# Patient Record
Sex: Female | Born: 1935 | Race: White | Hispanic: No
Health system: Midwestern US, Community
[De-identification: ages and names within clinical notes are randomized; demographics above are authoritative.]

---

## 2021-01-01 IMAGING — MR MRCP (MR Cholangiogram w/Reconstructions)
9 of 11 series · 35 of 48 positions shown · non-contrast
Comparison: none

[Series 2: t2_cor · coronal · 6.0mm · 1.25mm/px · 2 of 30 slices shown]
[im 1/30]
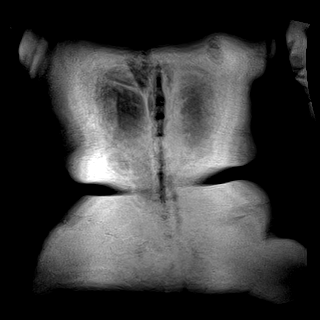
[im 30/30]
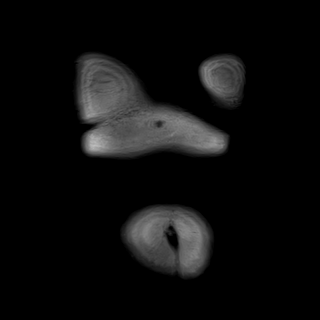

[Series 3: t2_axial_fs_bh · axial · 6.0mm · 1.19mm/px · z∈[-80,+129]mm · 2 of 30 slices shown]
[im 1/30]
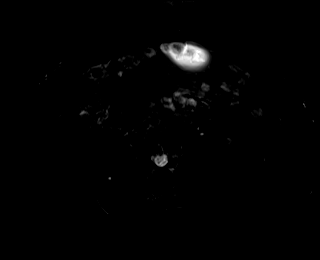
[im 30/30]
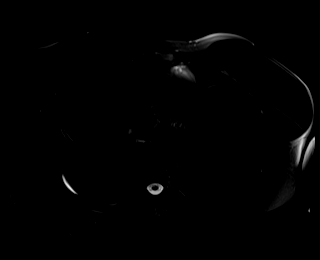

[Series 5: t2_cor r · coronal · 6.0mm · 1.25mm/px · 2 of 30 slices shown]
[im 1/30]
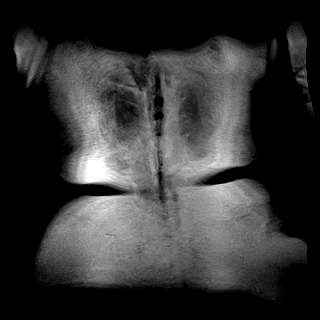
[im 30/30]
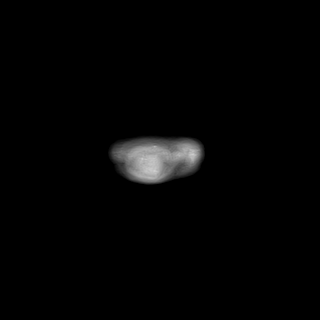

[Series 12: t2_space_cor_cs20_trig_384_iso · coronal · 1.0mm · 0.49mm/px · 5 of 66 slices shown]
[im 1/66]
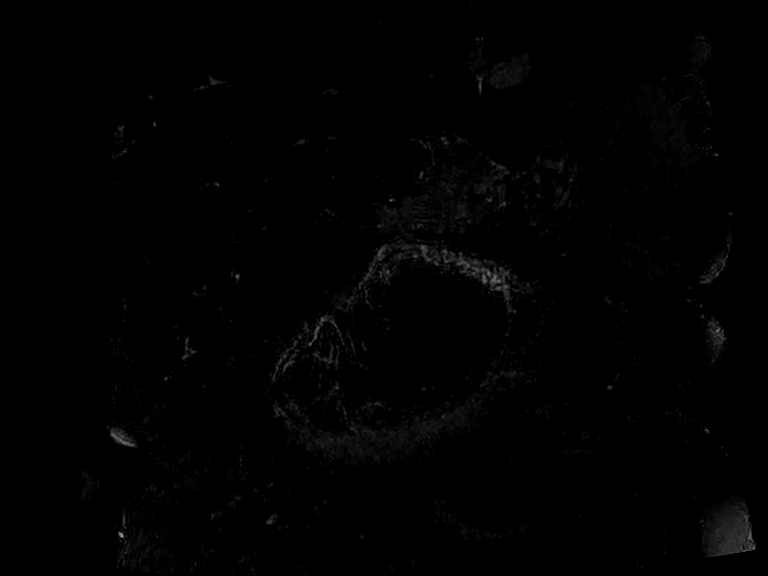
[im 17/66]
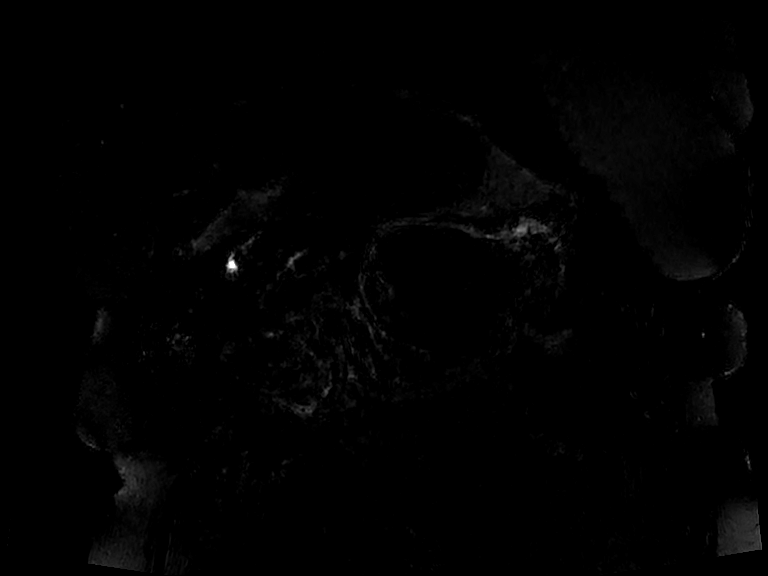
[im 33/66]
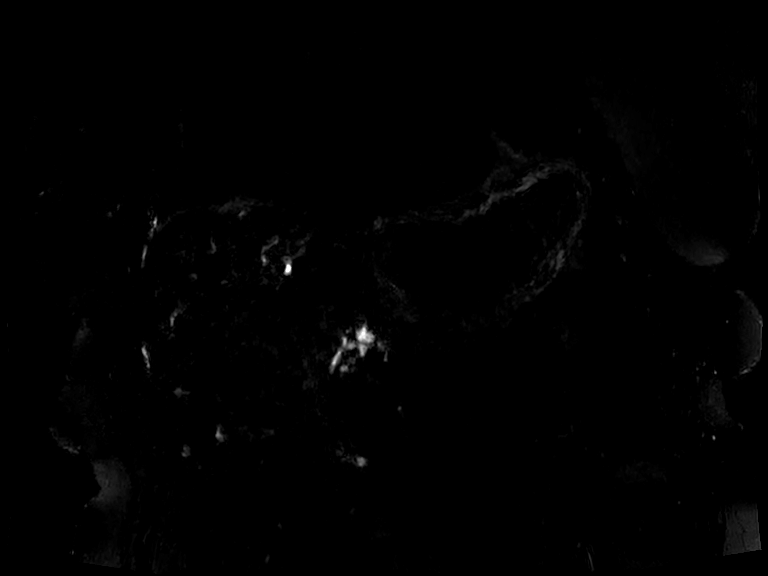
[im 49/66]
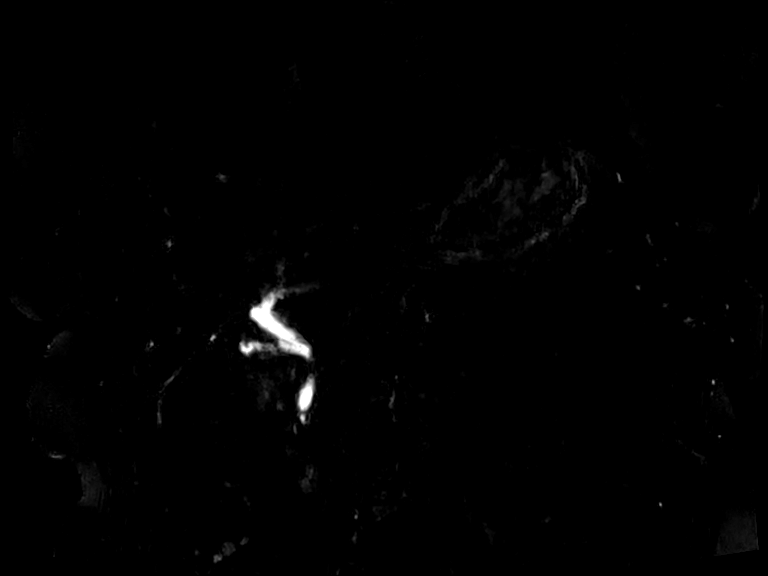
[im 66/66]
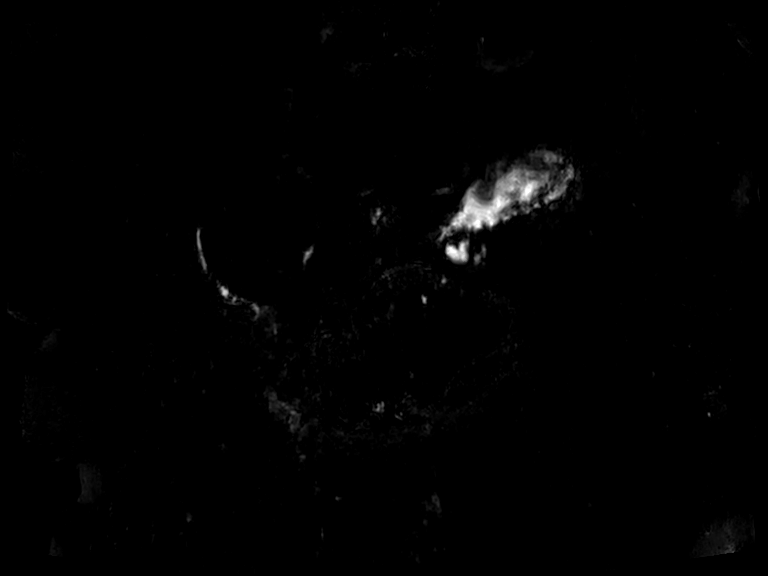

[Series 13: t2_space_cor_cs20_trig_384_iso_mip_radials · sagittal · 0.49mm/px · 1 of 4 slices shown]
[im 1/4]
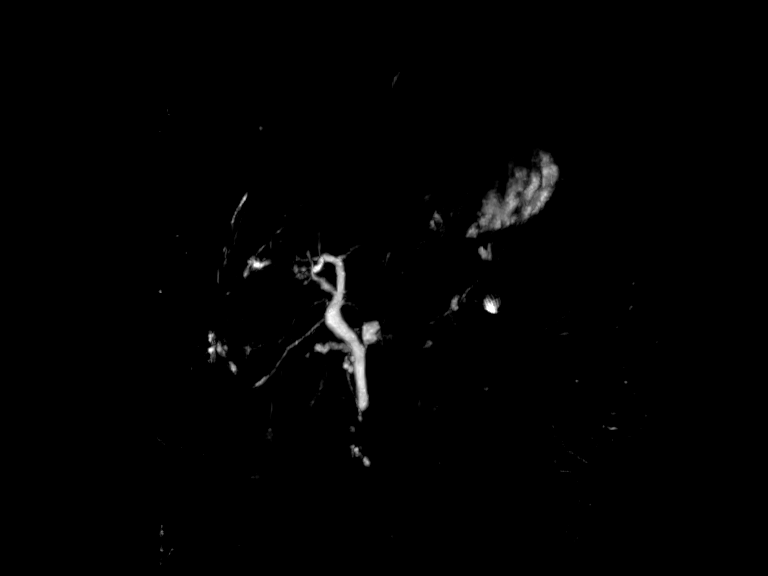

[Series 14: t1_vibe_(person_name)_pre_opp · axial · 3.0mm · 1.56mm/px · z∈[-35,+174]mm · 6 of 72 slices shown]
[im 1/72]
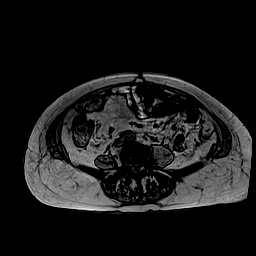
[im 15/72]
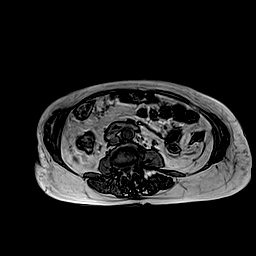
[im 29/72]
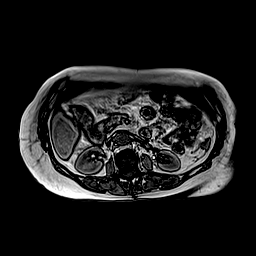
[im 43/72]
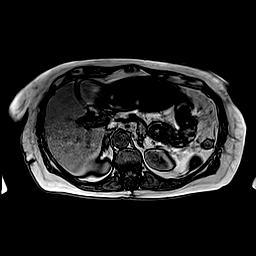
[im 57/72]
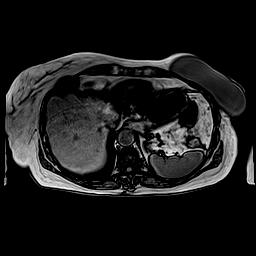
[im 72/72]
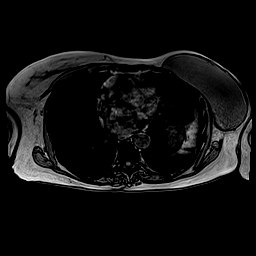

[Series 15: t1_vibe_(person_name)_pre_in · axial · 3.0mm · 1.56mm/px · z∈[-35,+174]mm · 6 of 72 slices shown]
[im 1/72]
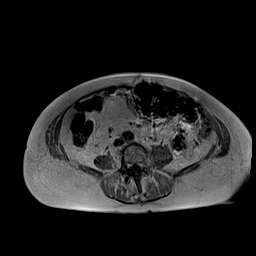
[im 15/72]
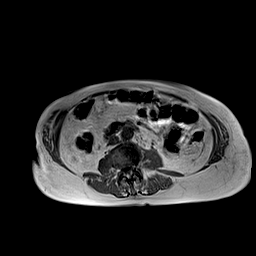
[im 29/72]
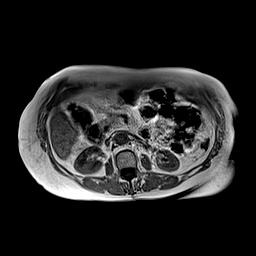
[im 43/72]
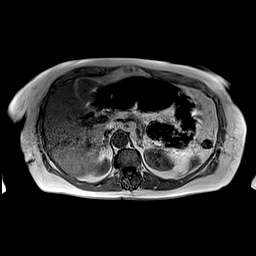
[im 57/72]
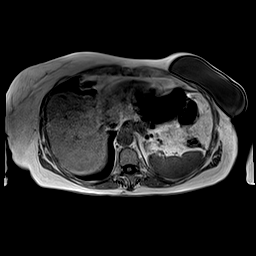
[im 72/72]
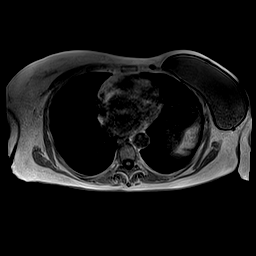

[Series 17: t1_vibe_(person_name)_pre_w · axial · 3.0mm · 1.56mm/px · z∈[-35,+174]mm · 6 of 72 slices shown]
[im 1/72]
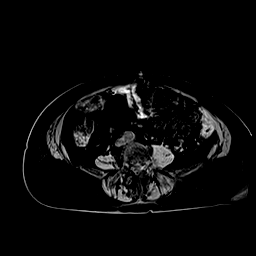
[im 15/72]
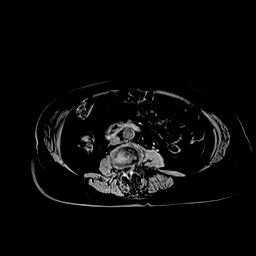
[im 29/72]
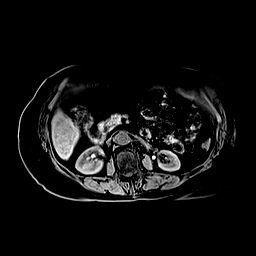
[im 43/72]
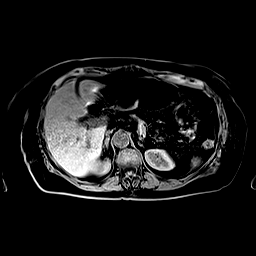
[im 57/72]
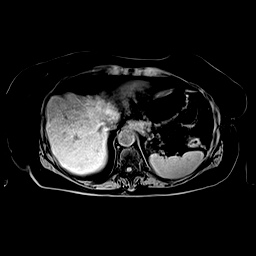
[im 72/72]
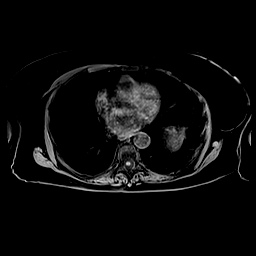

[Series 21: t1_vibe_axial_(person_name)_+c1_w · axial · 3.0mm · 1.56mm/px · z∈[-35,+130]mm · 5 of 72 slices shown]
[im 1/72]
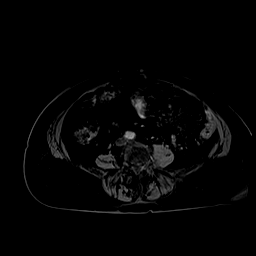
[im 15/72]
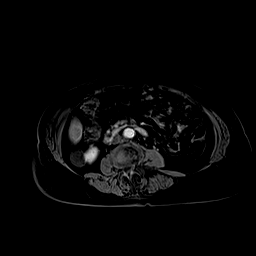
[im 29/72]
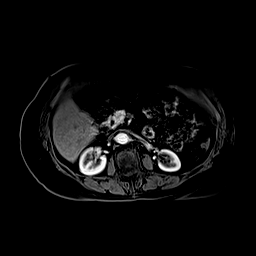
[im 43/72]
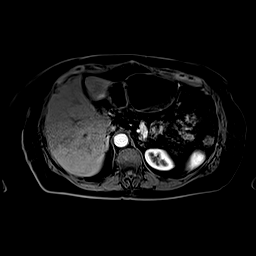
[im 57/72]
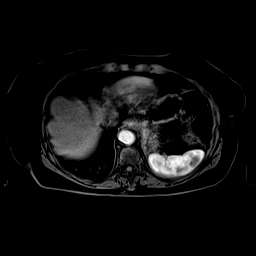

[35 of 48 positions shown; findings below may reference images not displayed]

REASON FOR EXAM

*
Epigastric pain

COMPARISON

*
None

TECHNIQUE

*
MR images of the abdomen were acquired before and after the administration of 15 mL ProHance IV contrast

*
Heavily T2 weighted MRCP images were acquired

*
3-D rotating MIP images were generated with post-processing software under concurrent physician supervision 

FINDINGS

Biliary Ducts

*
Maximum diameter of the common bile/common hepatic ducts is 9 mm

*
No mural irregularity

*
No rounded filling defects in the extra-hepatic bile ducts

*
Mild intrahepatic biliary dilatation

*
Gallbladder not visualized

Pancreatic Ducts

*
Normal ductal anatomy

*
Maximum diameter of the main pancreatic duct is 1 mm

*
No ductal stricture

Pancreatic Cystic Lesions

*
T2 very hyperintense

*
Nonenhancing

*
Multiple thin septa

*
No solid component

*
Two largest lesions described below

*
Pancreatic tail lesion ([DATE]) 2.1 x 1.3 cm

*
Pancreatic body lesion ([DATE]) 1.0 x 1.1 cm

*
Difficult to comment on potential communication with the main pancreatic duct

Liver

*
Smooth margins

*
Normal size

*
No steatosis

*
No lesion

Portal

*
Patent portal vein, splenic vein, and SMV

*
Patent hepatic veins

*
No ascites

*
No significant collateral veins

Additional

*
No solid pancreatic lesion

*
Renal cysts

*
No lymphadenopathy

*
Scoliosis with DISH

*
No malignant osseous enhancement

*
Left breast implant

IMPRESSION

*
Pancreatic cystic lesions. No worrisome features or high-risk stigmata to suggest an aggressive mucinous neoplasm. Consider follow-up MRCP in 6 months

## 2021-07-30 IMAGING — MR MRCP (MR Cholangiogram w/Reconstructions)
9 of 12 series · 35 of 48 positions shown · IV contrast (Prohance)
Comparison: none

[Series 2: t2_cor · coronal · 6.0mm · 1.09mm/px · 3 of 32 slices shown]
[im 1/32]
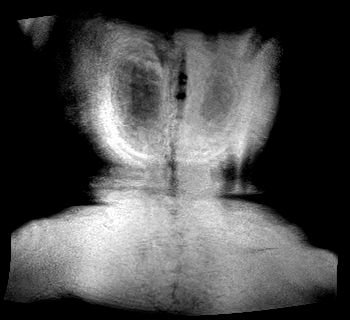
[im 16/32]
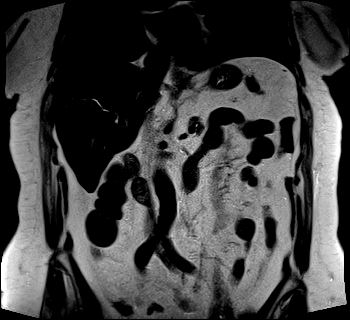
[im 32/32]
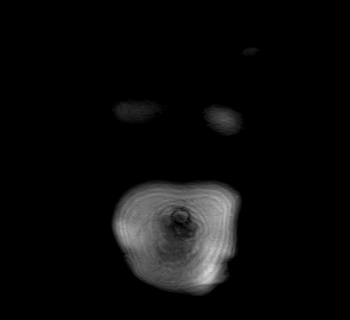

[Series 7: t2_space_cor_cs20_trig_384_iso · coronal · 1.0mm · 0.49mm/px · 6 of 87 slices shown]
[im 1/87]
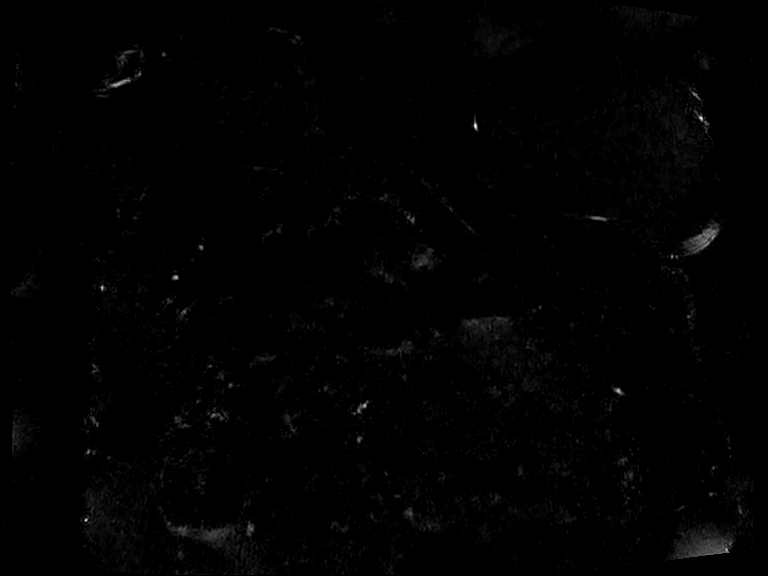
[im 18/87]
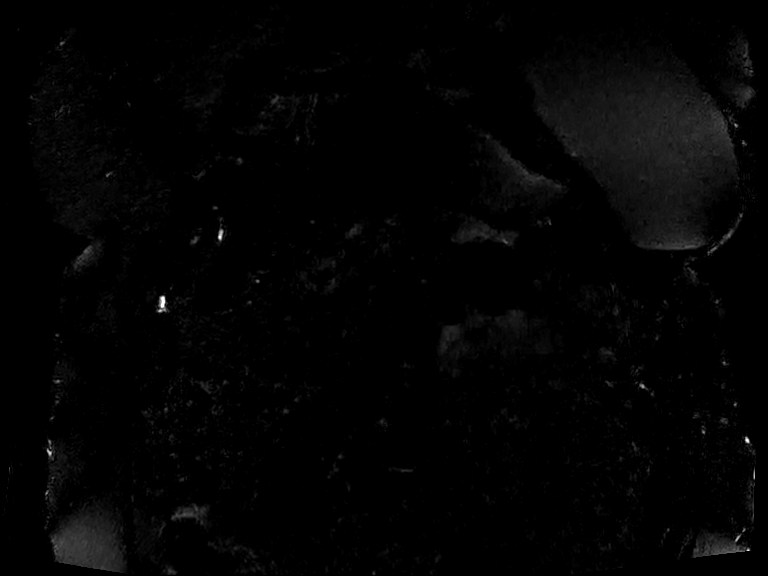
[im 35/87]
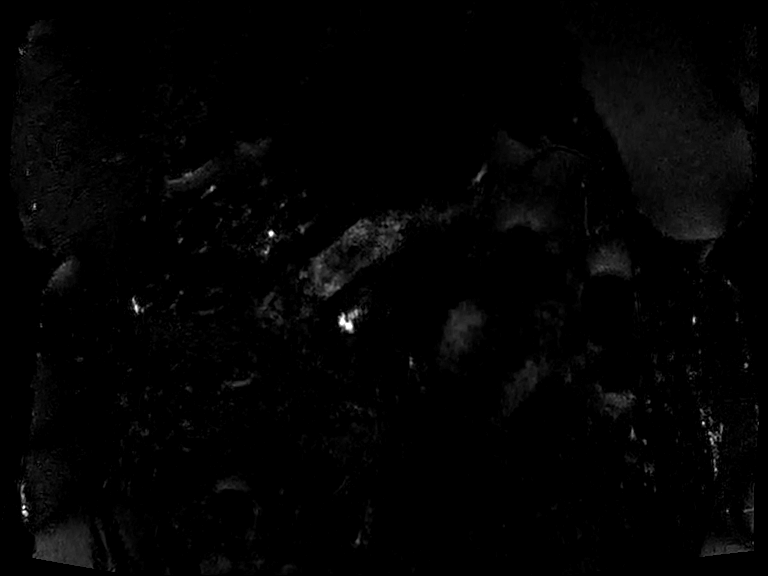
[im 52/87]
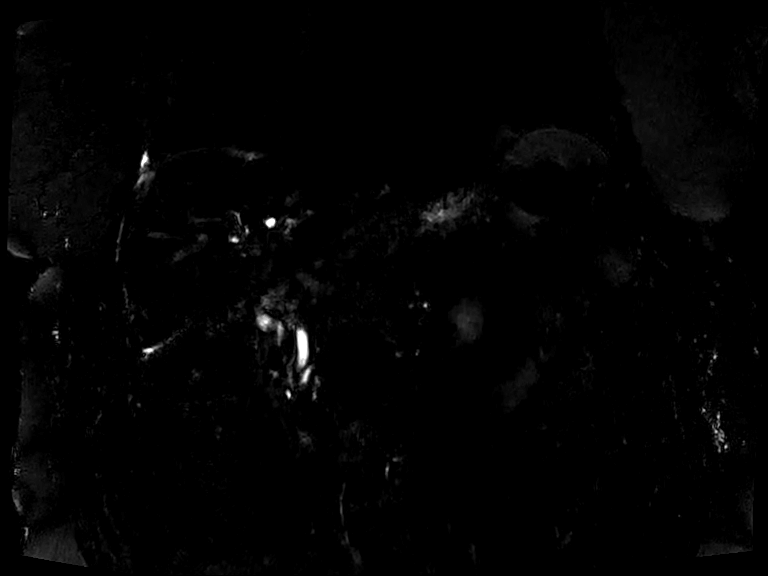
[im 69/87]
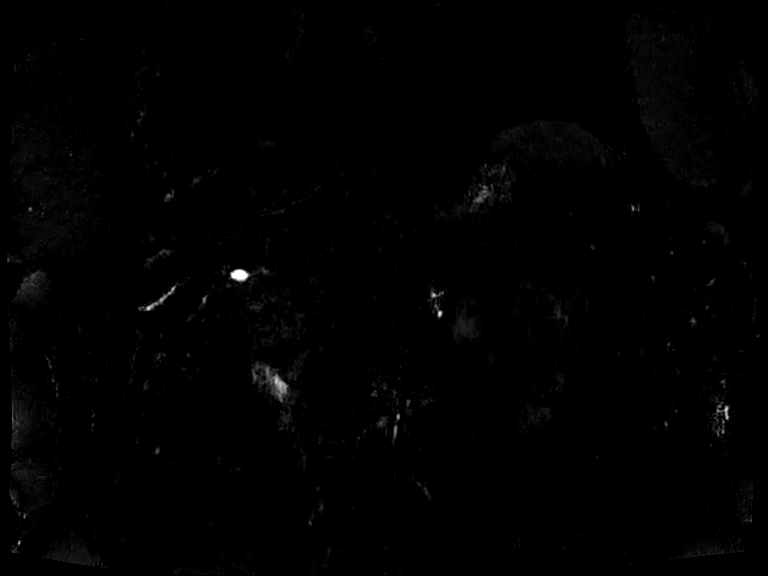
[im 87/87]
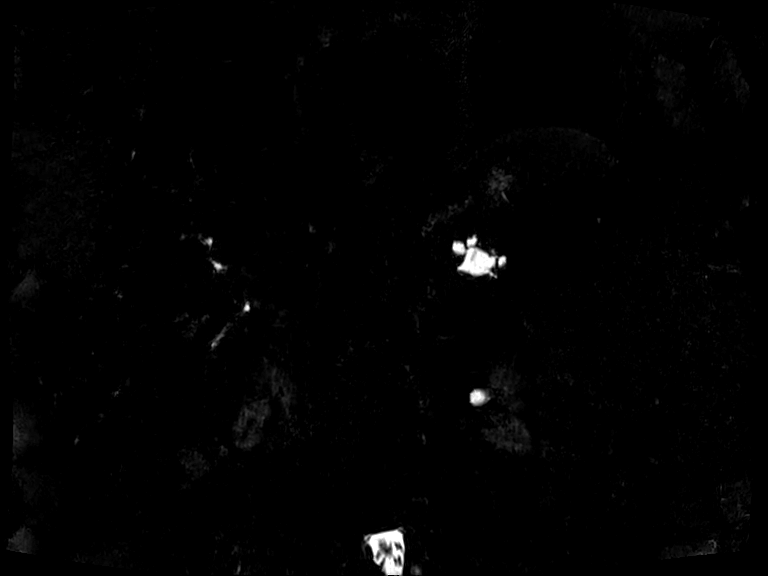

[Series 9: t2_axial_fs_bh · axial · 6.0mm · 1.19mm/px · z∈[-99,+137]mm · 3 of 34 slices shown]
[im 1/34]
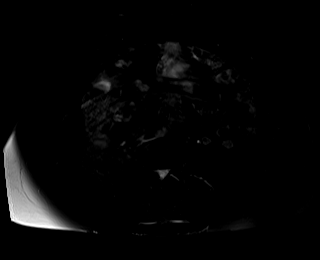
[im 17/34]
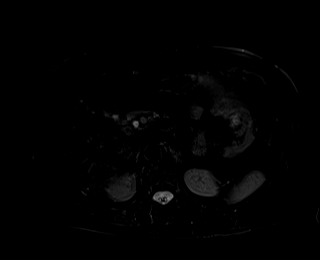
[im 34/34]
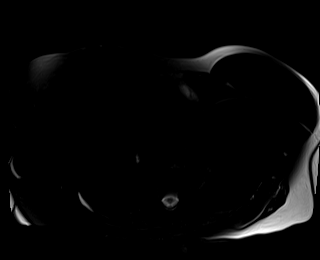

[Series 10: t1_vibe_(person_name)_pre_opp · axial · 3.0mm · 1.56mm/px · z∈[-77,+134]mm · 4 of 72 slices shown]
[im 1/72]
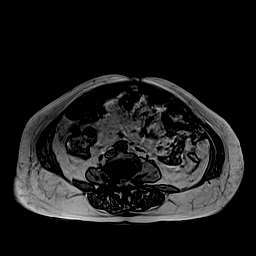
[im 24/72]
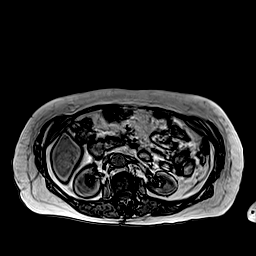
[im 48/72]
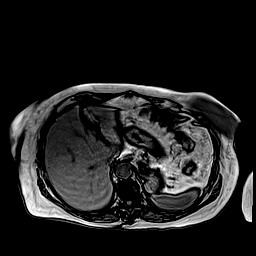
[im 72/72]
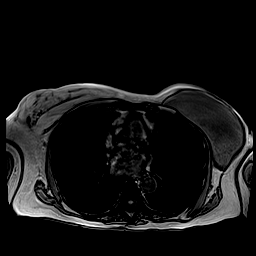

[Series 11: t1_vibe_(person_name)_pre_in · axial · 3.0mm · 1.56mm/px · z∈[-77,+134]mm · 4 of 72 slices shown]
[im 1/72]
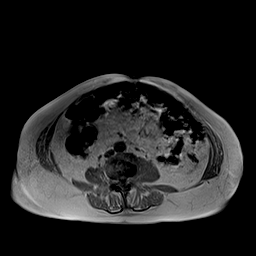
[im 24/72]
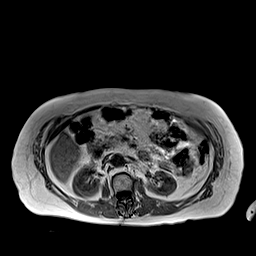
[im 48/72]
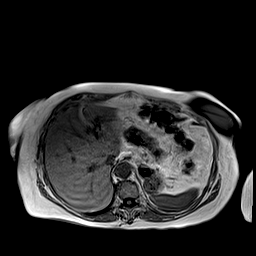
[im 72/72]
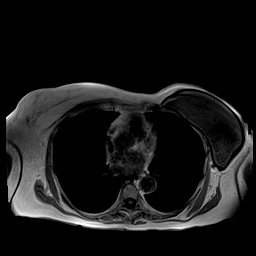

[Series 13: t1_vibe_(person_name)_pre_w · axial · 3.0mm · 1.56mm/px · z∈[-77,+134]mm · 4 of 72 slices shown]
[im 1/72]
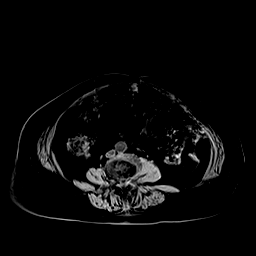
[im 24/72]
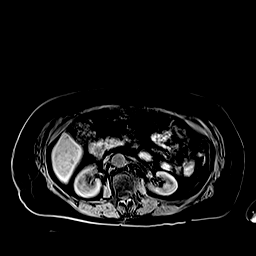
[im 48/72]
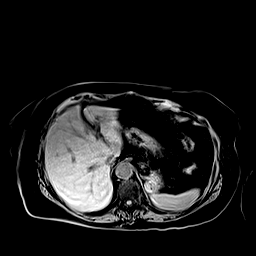
[im 72/72]
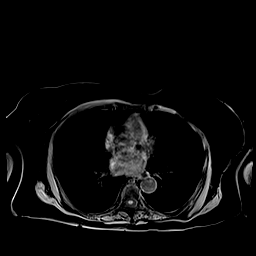

[Series 17: t1_vibe_axial_(person_name)_+c1_w · axial · 3.0mm · 1.56mm/px · z∈[-77,+134]mm · 4 of 72 slices shown]
[im 1/72]
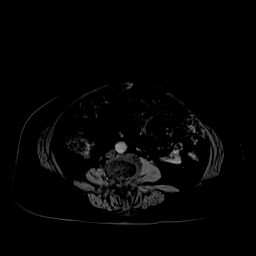
[im 24/72]
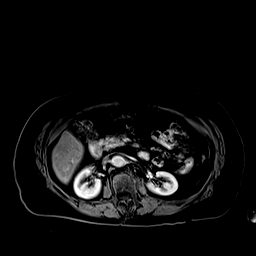
[im 48/72]
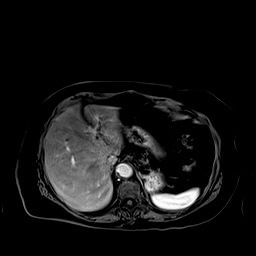
[im 72/72]
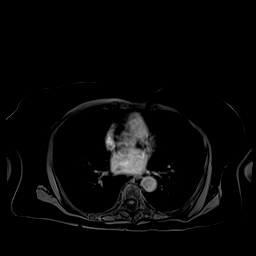

[Series 21: t1_vibe_axial_(person_name)_+c2_w · axial · 3.0mm · 1.56mm/px · z∈[-77,+134]mm · 4 of 72 slices shown]
[im 1/72]
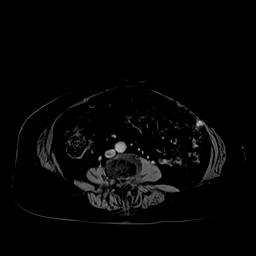
[im 24/72]
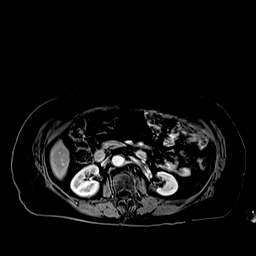
[im 48/72]
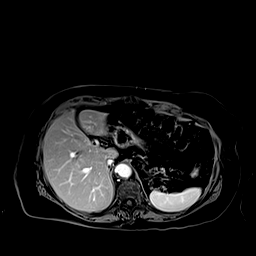
[im 72/72]
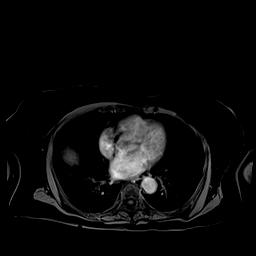

[Series 25: t1_vibe_axial_(person_name)_+c3_w · axial · 3.0mm · 1.56mm/px · z∈[-77,+63]mm · 3 of 72 slices shown]
[im 1/72]
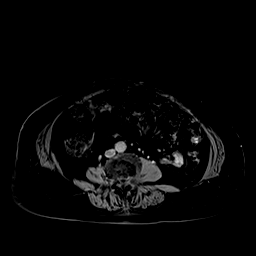
[im 24/72]
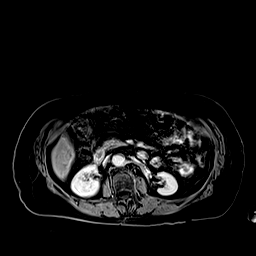
[im 48/72]
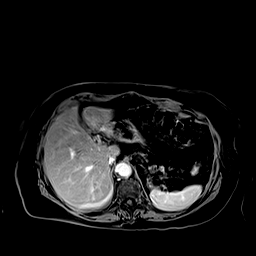

[35 of 48 positions shown; findings below may reference images not displayed]

REASON FOR EXAM

*
Pancreatic cystic lesion

COMPARISON

*
MRCP 01/01/2021

TECHNIQUE

*
MR images of the abdomen were acquired before and after the administration of 15 mL ProHance IV contrast

*
Heavily T2 weighted MRCP images were acquired

*
3-D rotating MIP images were generated with post-processing software under concurrent physician supervision

FINDINGS

Biliary Ducts

*
Maximum diameter of the common bile/common hepatic ducts is 9 mm

*
No mural irregularity

*
No rounded filling defects in the extra-hepatic bile ducts

*
Mild intrahepatic biliary dilatation

*
Cholecystectomy

Pancreatic Ducts

*
Normal ductal anatomy

*
Maximum diameter of the main pancreatic duct is 1.5 mm

*
No ductal stricture

Pancreatic Parenchyma

*
Cystic lesions are unchanged, largest 2 described below

*
Pancreatic tail cystic lesion ([DATE])   2.2 x 1.3 cm   --->   2.1 x 1.3 cm

*
Pancreatic head cystic lesion ([DATE])   1.0 x 1.1 cm   --->   0.9 x 1.0 cm

*
No significant solid lesion

Liver

*
Smooth margins

*
Normal size

*
No steatosis

*
No lesion

Portal

*
Patent portal vein, splenic vein, and SMV

*
Patent hepatic veins

*
No ascites

*
No significant collateral veins

Kidneys and Adrenals

*
Multiple renal cysts

Lymph Nodes

*
No suspicious lymph nodes

Additional

*
No malignant osseous enhancement

*
Left breast implant

IMPRESSION

*
Pancreatic cystic lesions are stable since 01/01/2021. Consider follow-up MRCP in one year

## 2022-11-04 ENCOUNTER — Emergency Department: Admit: 2022-11-05 | Payer: MEDICARE

## 2022-11-04 ENCOUNTER — Inpatient Hospital Stay: Admit: 2022-11-04 | Discharge: 2022-11-05 | Disposition: A | Discharge status: Home / Self Care | Payer: MEDICARE

## 2022-11-04 DIAGNOSIS — S8391XA Sprain of unspecified site of right knee, initial encounter: Secondary | ICD-10-CM

## 2022-11-04 NOTE — ED Provider Notes (Signed)
Independent APP Visit.       [facility] EMERGENCY DEPARTMENT  ED  Encounter Note  Admit Date/RoomTime: 11/04/2022 11:17 PM  ED Room: 31/31  NAME: Paula Cooper  DOB: 03/13/1934  MRN: [id number]  PCP: No primary care provider on file.    CHIEF COMPLAINT     Knee Injury (Pt was getting off an airplane at 1800 today and heard + felt her right knee pop when she was walking. Pt complaint of pain from her right ankle to her knee. Pt also has swelling of her knee. )    HISTORY OF PRESENT ILLNESS        Paula Cooper is a 88 y.o. female who presents to the ED via private vehicle with complaints of a popping sensation in her right knee while walking and now has pain primarily to the lateral aspect of the right knee that radiates into her leg.  Mild amount of swelling surrounding the knee.  Painful ambulation  REVIEW OF SYSTEMS     Pertinent positives and negatives are stated within HPI, all other systems reviewed and are negative.    Past Medical History:  has no past medical history on file.  Surgical History:  has no past surgical history on file.  Social History:  reports that she has never smoked. She has quit using smokeless tobacco. She reports that she does not currently use alcohol. She reports that she does not use drugs.  Family History: family history is not on file.   Allergies: Patient has no known allergies.  CURRENT MEDICATIONS       Previous Medications    No medications on file       SCREENINGS     Glasgow Coma Scale  Eye Opening: Spontaneous  Best Verbal Response: Oriented  Best Motor Response: Obeys commands  Glasgow Coma Scale Score: 15         CIWA Assessment  BP: 131/79  Pulse: 91       PHYSICAL EXAM   Oxygen Saturation Interpretation: Normal on room air analysis.        ED Triage Vitals   BP Temp Temp Source Pulse Respirations SpO2 Height Weight   11/04/22 1959 11/04/22 1850 11/04/22 1850 11/04/22 1850 11/04/22 1959 11/04/22 1850 -- --   131/79 97.9 F (36.6  C) Temporal 100 16 95 %         Physical Exam  General: Awake alert and oriented.  Well-appearing.  Nontoxic.  HEENT: Normocephalic, atraumatic.  Pupils equal  Neck: Normal range of motion  Cardiac: Regular rate  Respiratory: Respirations even, unlabored.  No respiratory distress  Abdomen: Nondistended  Musculoskelatal: Right knee with mild amount of swelling and rather diffuse tenderness.  Full range of motion with pain.  Easily palpated dorsalis pedis posterior tibial pulses neuro: Nonlateralizing  Skin: Flesh tone, warm, dry  Psych: Normal affect  DIAGNOSTIC RESULTS   (All laboratory and radiology results have been personally reviewed by myself)  Labs:  No results found for this visit on 11/04/22.  Imaging:  All Radiology results interpreted by Radiologist unless otherwise noted.  XR KNEE RIGHT (MIN 4 VIEWS)   Final Result   Normal right knee four views.             ED COURSE   Vitals:    Vitals:    11/04/22 1850 11/04/22 1959   BP:  131/79   Pulse: 100 91   Resp:  16   Temp:  97.9 F (36.6 C)    TempSrc: Temporal    SpO2: 95% 96%       Patient was given the following medications:  Medications   acetaminophen (TYLENOL) tablet 650 mg (650 mg Oral Given 11/04/22 2020)          PROCEDURES       REASSESSMENT   11/04/22       Time:   Patient's condition .    CONSULTS:  None  DIFFERENTIAL DX_MDM   MDM:   Social Determinants : None    Records Reviewed : None_ n/a per encounter visit    CC/HPI Summary, DDx, ED Course, and Reassessment: Patient presents with Knee Injury (Pt was getting off an airplane at 1800 today and heard + felt her right knee pop when she was walking. Pt complaint of pain from her right ankle to her knee. Pt also has swelling of her knee. )  Patient presenting with mechanical injury of the right knee.  X-rays not demonstrating any acute fracture.  She has full but painful range of motion.  Pain with weightbearing.  She is placed in a knee immobilizer.  Advised to follow-up with Ortho.  Educated on rice  therapy as well as over-the-counter analgesics.    Plan of Care/Counseling:  Pamella Pert Eman, APRN - CNP reviewed today's visit with the patient in addition to providing specific details for the plan of care and counseling regarding the diagnosis and prognosis.  Questions are answered at this time and are agreeable with the plan.    ASSESSMENT     1. Sprain of right knee, unspecified ligament, initial encounter    2. Acute pain of right knee        DISPOSITION   Discharged home.  Patient condition is good    Discharge Instructions:   Patient referred to  [facility], [address], [place] [address]  [phone]    Schedule an appointment as soon as possible for a visit       [facility] Emergency Department  [place] [address]  [phone]    If symptoms worsen    NEW MEDICATIONS     New Prescriptions    No medications on file     Electronically signed by Desiree Hane, APRN - CNP   DD: 11/04/22  **This report was transcribed using voice recognition software. Every effort was made to ensure accuracy; however, inadvertent computerized transcription errors may be present.  END OF ED PROVIDER NOTE      Desiree Hane, APRN - CNP  11/04/22 2348  ATTENDING PROVIDER ATTESTATION:     Supervising Physician, on-site, available for consultation, non-participatory in the evaluation or care of this patient       Mordecai Rasmussen, MD  11/05/22 0301

## 2022-11-04 NOTE — Discharge Instructions (Signed)
Wear knee immobilizer for comfort.  Consider purchasing a walker for ambulation assistance.  Over-the-counter Tylenol for pain.  Ice 20 minutes on 20 minutes off at least 4 times a day.  Elevate while at rest.

## 2022-11-05 MED ORDER — ACETAMINOPHEN 325 MG PO TABS
325 MG | Freq: Once | ORAL | Status: AC
Start: 2022-11-05 — End: 2022-11-04
  Administered 2022-11-05: 01:00:00 650 mg via ORAL

## 2022-11-05 MED FILL — PAIN & FEVER 325 MG PO TABS: 325 MG | ORAL | Qty: 2

## 2023-11-08 ENCOUNTER — Inpatient Hospital Stay
Admit: 2023-11-08 | Discharge: 2023-11-08 | Disposition: A | Discharge status: Home / Self Care | Payer: MEDICARE | Attending: Emergency Medicine

## 2023-11-08 ENCOUNTER — Emergency Department: Admit: 2023-11-08 | Payer: MEDICARE

## 2023-11-08 DIAGNOSIS — R52 Pain, unspecified: Secondary | ICD-10-CM

## 2023-11-08 DIAGNOSIS — J069 Acute upper respiratory infection, unspecified: Secondary | ICD-10-CM

## 2023-11-08 LAB — CBC WITH AUTO DIFFERENTIAL
Basophils %: 0 % (ref 0.0–2.0)
Basophils Absolute: 0.03 10*3/uL (ref 0.00–0.20)
Eosinophils %: 0 % (ref 0–6)
Eosinophils Absolute: 0.04 10*3/uL — ABNORMAL LOW (ref 0.05–0.50)
Hematocrit: 45.2 % (ref 34.0–48.0)
Hemoglobin: 15 g/dL (ref 11.5–15.5)
Immature Granulocytes %: 0 % (ref 0.0–5.0)
Immature Granulocytes Absolute: <0.03 10*3/uL (ref 0.00–0.58)
Lymphocytes %: 3 % — ABNORMAL LOW (ref 20.0–42.0)
Lymphocytes Absolute: 0.38 10*3/uL — ABNORMAL LOW (ref 1.50–4.00)
MCH: 30.5 pg (ref 26.0–35.0)
MCHC: 33.2 g/dL (ref 32.0–34.5)
MCV: 92.1 fL (ref 80.0–99.9)
MPV: 10.1 fL (ref 7.0–12.0)
Monocytes %: 8 % (ref 2.0–12.0)
Monocytes Absolute: 0.99 10*3/uL — ABNORMAL HIGH (ref 0.10–0.95)
Neutrophils %: 88 % — ABNORMAL HIGH (ref 43.0–80.0)
Neutrophils Absolute: 11.12 10*3/uL — ABNORMAL HIGH (ref 1.80–7.30)
Platelets: 195 10*3/uL (ref 130–450)
RBC Morphology: NORMAL
RBC: 4.91 m/uL (ref 3.50–5.50)
RDW: 12.4 % (ref 11.5–15.0)
WBC: 12.6 10*3/uL — ABNORMAL HIGH (ref 4.5–11.5)

## 2023-11-08 LAB — COMPREHENSIVE METABOLIC PANEL
ALT: 26 U/L (ref 0–32)
AST: 29 U/L (ref 0–31)
Albumin: 4.1 g/dL (ref 3.5–5.2)
Alkaline Phosphatase: 79 U/L (ref 35–104)
Anion Gap: 9 mmol/L (ref 7–16)
BUN: 12 mg/dL (ref 6–23)
CO2: 26 mmol/L (ref 22–29)
Calcium: 9.3 mg/dL (ref 8.6–10.2)
Chloride: 101 mmol/L (ref 98–107)
Creatinine: 0.8 mg/dL (ref 0.50–1.00)
Est, Glom Filt Rate: 67 mL/min/{1.73_m2} (ref >60)
Glucose: 145 mg/dL — ABNORMAL HIGH (ref 74–99)
Potassium: 4.2 mmol/L (ref 3.5–5.0)
Sodium: 136 mmol/L (ref 132–146)
Total Bilirubin: 0.7 mg/dL (ref 0.0–1.2)
Total Protein: 6.9 g/dL (ref 6.4–8.3)

## 2023-11-08 LAB — TROPONIN
Troponin, High Sensitivity: 13 ng/L — ABNORMAL HIGH (ref 0–9)
Troponin, High Sensitivity: 13 ng/L — ABNORMAL HIGH (ref 0–9)

## 2023-11-08 LAB — COVID-19, RAPID: SARS-CoV-2, Rapid: NOT DETECTED

## 2023-11-08 LAB — INFLUENZA A + B, PCR
Influenza A by PCR: NOT DETECTED
Influenza B by PCR: NOT DETECTED

## 2023-11-08 LAB — LACTIC ACID: Lactic Acid: 1.4 mmol/L (ref 0.5–2.2)

## 2023-11-08 LAB — BRAIN NATRIURETIC PEPTIDE: NT Pro-BNP: 519 pg/mL — ABNORMAL HIGH (ref 0–450)

## 2023-11-08 MED ORDER — ONDANSETRON HCL 4 MG/2ML IJ SOLN
4 | Freq: Once | INTRAMUSCULAR | Status: AC
Start: 2023-11-08 — End: 2023-11-08
  Administered 2023-11-08: 19:00:00 4 mg via INTRAVENOUS

## 2023-11-08 MED ORDER — ONDANSETRON HCL 4 MG PO TABS
4 | ORAL_TABLET | Freq: Three times a day (TID) | ORAL | 0 refills | Status: AC | PRN
Start: 2023-11-08 — End: 2023-11-13

## 2023-11-08 MED ORDER — SODIUM CHLORIDE 0.9 % IV BOLUS
0.9 | Freq: Once | INTRAVENOUS | Status: AC
Start: 2023-11-08 — End: 2023-11-08
  Administered 2023-11-08: 18:00:00 1000 mL via INTRAVENOUS

## 2023-11-08 MED ORDER — ACETAMINOPHEN 325 MG PO TABS
325 | Freq: Once | ORAL | Status: AC
Start: 2023-11-08 — End: 2023-11-08
  Administered 2023-11-08: 18:00:00 650 mg via ORAL

## 2023-11-08 MED FILL — ONDANSETRON HCL 4 MG/2ML IJ SOLN: 4 MG/2ML | INTRAMUSCULAR | Qty: 2

## 2023-11-08 MED FILL — MAPAP 325 MG PO TABS: 325 MG | ORAL | Qty: 2

## 2023-11-08 NOTE — Discharge Instructions (Signed)
XR CHEST PORTABLE   Final Result   No acute process.

## 2023-11-08 NOTE — ED Provider Notes (Signed)
[facility] EMERGENCY DEPARTMENT  EMERGENCY DEPARTMENT ENCOUNTER        Pt Name: Paula Cooper  MRN: [id number]  Birthdate [birth year]  Date of evaluation: 11/08/2023  Provider: Salley Scarlet, MD  PCP: No primary care provider on file.  Note Started: 10:37 AM EST 11/08/23    CHIEF COMPLAINT       Chief Complaint   Patient presents with    Generalized Body Aches     Flu like symptoms worse in past 2 days, onset Thursday. Aches, nausea, weakness, fevers/chills, fatigue. Went to Dr. Lysle Rubens for flu, covid and strep Sat       HISTORY OF PRESENT ILLNESS: 1 or more Elements   History From: Patient and attending    Limitations to history : None    Paula Cooper is a 89 y.o. female who presents with productive cough, nausea and vomiting, failure.  Complaints been ongoing for the past 2 days.  Says it is so bad that she has not been able to sleep.  She has tried using baby Tylenol, Mucinex.  In attempts to break the mucus and make her airway clear but says nothing has worked.     She is unable to eat or drink anything for the past 2 days. Says it is not associated with meals, she is not even able to keep water down.    Patient denies fever, chills, headache, shortness of breath, chest pain, abdominal pain, nausea, vomiting, diarrhea, lightheadedness, dysuria, hematuria, hematochezia, and melena.    She has a pertinent past medical history of syncopes and was given a loop recorder for it 3 years ago loop recorder was removed back in August.  It showed no evidence.  Patient is also scheduled for breast implant removal which she got after her left breast mastectomy.  She denies any history of heart disease, strokes, blood clots in her legs or lungs.    Nursing Notes were all reviewed and agreed with or any disagreements were addressed in the HPI.        REVIEW OF EXTERNAL NOTES :         None      REVIEW OF SYSTEMS :           Positives and Pertinent negatives as per HPI.      SURGICAL HISTORY   No past surgical history on file.    CURRENTMEDICATIONS       Discharge Medication List as of 11/08/2023  2:56 PM          ALLERGIES     Patient has no known allergies.    FAMILYHISTORY     No family history on file.     SOCIAL HISTORY       Social History     Tobacco Use    Smoking status: Never    Smokeless tobacco: Former   Substance Use Topics    Alcohol use: Not Currently    Drug use: Never       SCREENINGS        Glasgow Coma Scale  Eye Opening: Spontaneous  Best Verbal Response: Oriented  Best Motor Response: Obeys commands  Glasgow Coma Scale Score: 15                CIWA Assessment  BP: (!) 106/58  Pulse: 93           PHYSICAL EXAM  1 or more Elements     ED Triage Vitals [11/08/23  1008]   BP Systolic BP Percentile Diastolic BP Percentile Temp Temp Source Pulse Respirations SpO2   118/66 -- -- 98 F (36.7 C) Oral (!) 109 22 91 %      Height Weight - Scale         1.549 m (5\' 1" ) 59.4 kg (131 lb)                  Elderly female of average height and build lying in bed appearing uncomfortable because of productive cough.  Patient is diaphoretic however no fevers.  Patient's mucosal membranes and skin is dry.    Constitutional/General: Alert and oriented x3  Head: Normocephalic and atraumatic  Eyes: PERRL, EOMI, conjunctiva normal, sclera non icteric  ENT:  Oropharynx clear, handling secretions, no trismus, no asymmetry of the posterior oropharynx or uvular edema  Neck: Supple, full ROM, no stridor, no meningeal signs  Respiratory: auscultation reveals bilateral crackling breath sounds more pronounced on the right lower lung zones. Not in respiratory distress.  Cardiovascular:  Regular rate. irregular rhythm. Equal extremity pulses.   GI:  Abdomen Soft, Non tender, Non distended. No rebound, guarding, or rigidity.   Musculoskeletal: Moves all extremities x 4. Warm and well perfused, no clubbing, no cyanosis, no edema. Capillary refill <3 seconds  Integument: skin warm and dry. No  rashes.   Neurologic: no focal deficits, CN II-XII grossly intact. Symmetric strength 5/5 in the upper and lower extremities bilaterally  Psychiatric: Normal Affect            DIAGNOSTIC RESULTS   LABS:    Labs Reviewed   CBC WITH AUTO DIFFERENTIAL - Abnormal; Notable for the following components:       Result Value    WBC 12.6 (*)     Neutrophils % 88 (*)     Lymphocytes % 3 (*)     Neutrophils Absolute 11.12 (*)     Lymphocytes Absolute 0.38 (*)     Monocytes Absolute 0.99 (*)     Eosinophils Absolute 0.04 (*)     All other components within normal limits   COMPREHENSIVE METABOLIC PANEL - Abnormal; Notable for the following components:    Glucose 145 (*)     All other components within normal limits   TROPONIN - Abnormal; Notable for the following components:    Troponin, High Sensitivity 13 (*)     All other components within normal limits   BRAIN NATRIURETIC PEPTIDE - Abnormal; Notable for the following components:    NT Pro-BNP 519 (*)     All other components within normal limits   TROPONIN - Abnormal; Notable for the following components:    Troponin, High Sensitivity 13 (*)     All other components within normal limits   COVID-19, RAPID   INFLUENZA A + B, PCR   LACTIC ACID       As interpreted by me, the above displayed labs are abnormal. All other labs obtained during this visit were within normal range or not returned as of this dictation.      EKG Interpretation  Interpreted by emergency department physician, Salley Scarlet, MD      EKG shows sinus tach cardia with premature atrial complexes.  Heart rate is 103 bpm.      RADIOLOGY:   Non-plain film images such as CT, Ultrasound and MRI are read by the radiologist. Interpretation per the Radiologist below, if available at the time of this note:    XR CHEST PORTABLE  Final Result   No acute process.           No results found.    No results found.    PROCEDURES   Unless otherwise noted below, none          PAST MEDICAL HISTORY/Chronic Conditions  Affecting Care      has no past medical history on file.     EMERGENCY DEPARTMENT COURSE    Vitals:    Vitals:    11/08/23 1215 11/08/23 1245 11/08/23 1315 11/08/23 1400   BP: (!) 111/58  (!) 106/58    Pulse: (!) 101 96 93 93   Resp: 24 25 24 22    Temp:       TempSrc:       SpO2: 94% 100%     Weight:       Height:           Patient was given the following medications:  Medications   sodium chloride 0.9 % bolus 1,000 mL (0 mLs IntraVENous Stopped 11/08/23 1403)   acetaminophen (TYLENOL) tablet 650 mg (650 mg Oral Given 11/08/23 1232)   ondansetron (ZOFRAN) injection 4 mg (4 mg IntraVENous Given 11/08/23 1404)           Is this patient to be included in the SEP-1 Core Measure due to severe sepsis or septic shock?   No Exclusion criteria - the patient is NOT to be included for SEP-1 Core Measure due to: 2+ SIRS criteria are not met        Medical Decision Making/Differential Diagnosis:    CC/HPI Summary, Social Determinants of health, Records Reviewed, DDx, testing done/not done, ED Course, Reassessment, disposition considerations/shared decision making with patient, consults, disposition:            Patient was given Zofran responded appropriately.  Vitals stabilized with fluid hydration.  Patient was taken to discharge home continue antiemetic therapy.  Patient will follow-up with PCP.      CONSULTS: (Who and What was discussed)  None      FINAL IMPRESSION      1. Body aches    2. Viral URI          DISPOSITION/PLAN     DISPOSITION Decision To Discharge 11/08/2023 02:46:26 PM    DISPOSITION  Disposition: Discharge to home  Patient condition is stable    11/08/23, 10:37 AM EST.    Salley Scarlet, MD  Emergency Medicine    PATIENT REFERRED TO:  [facility] PCP  [phone]  Call   Establish care    [facility] Emergency Department  [address]  [place] [address]  [phone]  Go to   If symptoms worsen      DISCHARGE MEDICATIONS:  Discharge Medication List as of  11/08/2023  2:56 PM        START taking these medications    Details   ondansetron (ZOFRAN) 4 MG tablet Take 1 tablet by mouth every 8 hours as needed for Nausea or Vomiting, Disp-12 tablet, R-0Normal             DISCONTINUED MEDICATIONS:  Discharge Medication List as of 11/08/2023  2:56 PM                 (Please note that portions of this note were completed with a voice recognition program.  Efforts were made to edit the dictations but occasionally words are mis-transcribed.)    Salley Scarlet, MD (electronically signed)

## 2023-11-09 LAB — EKG 12-LEAD
Atrial Rate: 103 {beats}/min
P Axis: 65 degrees
P-R Interval: 160 ms
Q-T Interval: 328 ms
QRS Duration: 70 ms
QTc Calculation (Bazett): 429 ms
R Axis: 55 degrees
T Axis: 45 degrees
Ventricular Rate: 103 {beats}/min

## 2024-06-04 ENCOUNTER — Inpatient Hospital Stay
Admit: 2024-06-04 | Discharge: 2024-06-04 | Disposition: A | Discharge status: Home / Self Care | Payer: Medicare (Managed Care)

## 2024-06-04 ENCOUNTER — Emergency Department: Admit: 2024-06-04 | Payer: Medicare (Managed Care)

## 2024-06-04 DIAGNOSIS — M5431 Sciatica, right side: Secondary | ICD-10-CM

## 2024-06-04 DIAGNOSIS — M5116 Intervertebral disc disorders with radiculopathy, lumbar region: Secondary | ICD-10-CM

## 2024-06-04 MED ORDER — NAPROXEN 500 MG PO TABS
500 | ORAL_TABLET | Freq: Two times a day (BID) | ORAL | 0 refills | 30.00000 days | Status: AC | PRN
Start: 2024-06-04 — End: 2024-06-09

## 2024-06-04 MED ORDER — LIDOCAINE 4 % EX PTCH
4 | Freq: Every day | CUTANEOUS | 0 refills | 30.00000 days | Status: AC
Start: 2024-06-04 — End: 2024-06-09

## 2024-06-04 MED ORDER — METHOCARBAMOL 750 MG PO TABS
750 | Freq: Once | ORAL | Status: AC
Start: 2024-06-04 — End: 2024-06-04
  Administered 2024-06-04: 20:00:00 750 mg via ORAL

## 2024-06-04 MED ORDER — METHOCARBAMOL 750 MG PO TABS
750 | ORAL_TABLET | Freq: Four times a day (QID) | ORAL | 0 refills | 10.00000 days | Status: AC
Start: 2024-06-04 — End: 2024-06-09

## 2024-06-04 MED ORDER — KETOROLAC TROMETHAMINE 30 MG/ML IJ SOLN
30 | Freq: Once | INTRAMUSCULAR | Status: AC
Start: 2024-06-04 — End: 2024-06-04
  Administered 2024-06-04: 20:00:00 30 mg via INTRAMUSCULAR

## 2024-06-04 MED ORDER — LIDOCAINE 4 % EX PTCH
4 | Freq: Once | CUTANEOUS | Status: DC
Start: 2024-06-04 — End: 2024-06-04
  Administered 2024-06-04: 20:00:00 1 via TRANSDERMAL

## 2024-06-04 MED FILL — METHOCARBAMOL 750 MG PO TABS: 750 MG | ORAL | Qty: 1 | Fill #0

## 2024-06-04 MED FILL — ASPERCREME LIDOCAINE 4 % EX PTCH: 4 % | CUTANEOUS | Qty: 1 | Fill #0

## 2024-06-04 MED FILL — KETOROLAC TROMETHAMINE 30 MG/ML IJ SOLN: 30 MG/ML | INTRAMUSCULAR | Qty: 1 | Fill #0

## 2024-06-04 NOTE — ED Provider Notes (Cosign Needed)
 [facility] EMERGENCY DEPARTMENT  EMERGENCY DEPARTMENT ENCOUNTER        Pt Name: [name]  MRN: [id number]  Birthdate 02-Jan-1934  Date of evaluation: 06/04/2024  Provider: Bettie Bruce, APRN - CNP  PCP: No primary care provider on file.  Note Started: 2:26 PM EDT 06/04/24      APP. I have evaluated this patient.        CHIEF COMPLAINT       Chief Complaint   Patient presents with    Hip Pain     Right sided. Hx: hip replacement 2016. No known injury        HISTORY OF PRESENT ILLNESS: 1 or more Elements     History from : Patient    Limitations to history : None    Paula Cooper is a 89 y.o. female who presents to the ED with complaints of right lower back pain that radiates into the right hip and down the right leg x 1 week.  Patient denies any numbness, tingling, weakness, saddle anesthesia, loss of bowel or bladder control, urinary retention.  Patient states she was unable to contact her doctor due to the fact that she is visiting from [place] .  Patient does have a history of back problems.  Patient had a right hip replacement in 2016.  Patient denies any injury.  Patient denies any chest pain, shortness of breath, abdominal pain, nausea, vomiting, diarrhea, constipation, urinary complaints, headache, lightheadedness, dizziness, fever, chills, body aches.  Patient denies any other symptoms.  Patient has other complaints at this time.    Nursing Notes were all reviewed and agreed with or any disagreements were addressed in the HPI.    REVIEW OF SYSTEMS :      Review of Systems   Constitutional: Negative.    HENT: Negative.     Eyes: Negative.    Respiratory: Negative.     Cardiovascular: Negative.    Gastrointestinal: Negative.    Endocrine: Negative.    Genitourinary: Negative.    Musculoskeletal:  Positive for back pain.   Skin: Negative.    Allergic/Immunologic: Negative.    Neurological: Negative.    Hematological: Negative.    Psychiatric/Behavioral: Negative.          Positives and Pertinent negatives as per HPI.     SURGICAL HISTORY   No past surgical history on file.    CURRENTMEDICATIONS       Discharge Medication List as of 06/04/2024  5:37 PM          ALLERGIES     Patient has no known allergies.    FAMILYHISTORY     No family history on file.     SOCIAL HISTORY       Social History     Tobacco Use    Smoking status: Never    Smokeless tobacco: Former   Substance Use Topics    Alcohol use: Not Currently    Drug use: Never       SCREENINGS                         CIWA Assessment  BP: (!) 124/49  Pulse: 88           PHYSICAL EXAM  1 or more Elements     ED Triage Vitals   BP Systolic BP Percentile Diastolic BP Percentile Temp Temp Source Pulse Respirations SpO2   06/04/24 1422 -- -- 06/04/24 1422  06/04/24 1422 06/04/24 1422 06/04/24 1422 06/04/24 1422   (!) 124/49   97.7 F (36.5 C) Oral 88 16 100 %      Height Weight - Scale         06/04/24 1350 06/04/24 1350         1.524 m (5') 59.4 kg (131 lb)             Physical Exam  Vitals and nursing note reviewed.   Constitutional:       General: She is not in acute distress.     Appearance: Normal appearance. She is not ill-appearing, toxic-appearing or diaphoretic.   HENT:      Head: Normocephalic and atraumatic.   Eyes:      Extraocular Movements: Extraocular movements intact.      Conjunctiva/sclera: Conjunctivae normal.   Cardiovascular:      Rate and Rhythm: Normal rate.      Pulses: Normal pulses.   Pulmonary:      Effort: Pulmonary effort is normal.   Musculoskeletal:      Cervical back: Normal and normal range of motion.      Thoracic back: Normal.      Lumbar back: Tenderness present. No swelling, edema, deformity, signs of trauma, lacerations, spasms or bony tenderness. Decreased range of motion.      Right hip: Tenderness and bony tenderness present. No deformity, lacerations or crepitus. Normal range of motion. Normal strength.      Left hip: Normal.      Right upper leg: Normal.      Left upper leg: Normal.       Right knee: Normal.      Left knee: Normal.      Right lower leg: Normal.      Left lower leg: Normal.      Right ankle: Normal.      Left ankle: Normal.      Right foot: Normal.      Left foot: Normal.      Comments: Right paraspinal lumbar tenderness, no midline tenderness  Decreased lumbar ROM due to pain  RLE neurovascularly intact with normal strength, sensation, ROM  RLE compartment soft and compressible  Tenderness right hip   Skin:     General: Skin is warm and dry.      Capillary Refill: Capillary refill takes less than 2 seconds.   Neurological:      General: No focal deficit present.      Mental Status: She is alert and oriented to person, place, and time.   Psychiatric:         Mood and Affect: Mood normal.         Behavior: Behavior normal.     MEDICAL DECISION MAKING:   I considered, but did not perform, additional testing such as MRI Spine or Brain, as well as admission or transfer to a higher level of care.     I utilized an evidence-based risk rating tool (CMT) along with my training and experience to weigh the risk of discharge against the risks of further testing, imaging, or hospitalization. At this time, I estimate the risks of additional testing, imaging, or hospitalization (in the case of discharge) to be equal to or greater than the risk of discharge. Given the symptoms and findings present at this time, the chance of SEA or SCC is so remote that additional testing or imaging is more likely to harm the patient than diagnose SEA. [id number]    SHARED DECISION  MAKING:   I discussed my risk assessment with the patient. The patient understands and consents to the risk of disposition/plan, as well as the risk of uncertainty in estimating outcomes. [id number]         Red flag score 0 with intact neuroexam.    DIAGNOSTIC RESULTS   LABS:    Labs Reviewed - No data to display    When ordered only abnormal lab results are displayed. All other labs were within normal range or not  returned as of this dictation.    EKG: When ordered, EKG's are interpreted by the Emergency Department Physician in the absence of a cardiologist.  Please see their note for interpretation of EKG.    RADIOLOGY:   Non-plain film images such as CT, Ultrasound and MRI are read by the radiologist. Plain radiographic images are visualized and preliminarily interpreted by the ED Provider with the below findings:    Interpretation per the Radiologist below, if available at the time of this note:    CT LUMBAR SPINE WO CONTRAST   Final Result   1. No fracture or acute osseous abnormality.   2. Lumbar spine multilevel degenerative changes with secondary acquired   spinal stenosis, greatest at the L3-4 level. Details above.   3. Lumbar dextroscoliosis.         XR HIP RIGHT (2-3 VIEWS)   Final Result      Unremarkable appearing right hip arthroplasty with no evidence of fracture or   malalignment.           No results found.    No results found.    PROCEDURES   Unless otherwise noted below, none     Procedures    CRITICAL CARE TIME (.cctime)   None    PAST MEDICAL HISTORY      has no past medical history on file.     Chronic Conditions affecting Care: History of lower back pain    EMERGENCY DEPARTMENT COURSE and DIFFERENTIAL DIAGNOSIS/MDM:   Vitals:    Vitals:    06/04/24 1350 06/04/24 1422   BP:  (!) 124/49   Pulse:  88   Resp:  16   Temp:  97.7 F (36.5 C)   TempSrc:  Oral   SpO2:  100%   Weight: 59.4 kg (131 lb)    Height: 1.524 m (5')        Patient was given the following medications:  Medications   ketorolac (TORADOL) injection 30 mg (30 mg IntraMUSCular Given 06/04/24 1624)   methocarbamol (ROBAXIN) tablet 750 mg (750 mg Oral Given 06/04/24 1624)         CONSULTS: (Who and What was discussed)  None  Discussion with Other Profesionals : None    Social Determinants : Patient is visiting from [place]  and is unsure how long she will be here.    Records Reviewed : None    CC/HPI Summary, DDx, ED Course, and Reassessment: Paula Cooper is a 89 y.o. female who presents to the ED with complaints of right lower back pain that radiates into the right hip and down the right leg x 1 week.  Patient denies any numbness, tingling, weakness, saddle anesthesia, loss of bowel or bladder control, urinary retention.  Patient states she was unable to contact her doctor due to the fact that she is visiting from [place] .  Patient does have a history of back problems.  Patient had a right hip replacement in 2016.  Patient denies any injury.  Patient denies any chest pain, shortness of breath, abdominal pain, nausea, vomiting, diarrhea, constipation, urinary complaints, headache, lightheadedness, dizziness, fever, chills, body aches.  Patient denies any other symptoms.  Patient has other complaints at this time.  Differential diagnosis includes but is not limited to cauda equina, cord compression, fracture.  CT lumbar spine and x-ray right hip obtained.  Patient received Toradol, Robaxin, lidocaine patch.  X-ray right hip showed unremarkable appearing right hip arthroplasty with no evidence of fracture or malalignment.  CT lumbar spine showed no fracture or acute osseous abnormality, lumbar spine multilevel degenerative changes with secondary acquired spinal stenosis greatest at the L3-4 level, lumbar dextroscoliosis.  Results reviewed with patient.  Patient reported pain relief and increased ability to move after receiving medications.  Discussed diagnosis and plan to follow-up with [facility] primary care and neurosurgery along with the use of naproxen, Robaxin, and lidocaine patches.  Patient advised to return to the ED if any new or worsening symptoms.  Patient verbalized understanding and is in agreement with the plan.         Disposition Considerations (include 1 Tests not done, Shared Decision Making, Pt Expectation of Test or Tx.): Based on HPI, PE, and imaging findings, no further testing is indicated at this time.  Diagnosis of sciatica of  right side and degeneration of intervertebral disc of lumbar region is consistent exam findings.  Appropriate for outpatient management follow-up with [facility] primary care and neurosurgery.    The emergency provider has spoken with patient and discussed today's results, in addition to providing specific details for the plan of care and counseling regarding the diagnosis and prognosis.  Naproxen, Robaxin, lidocaine patches prescribed and electronically sent to patient's pharmacy.  Patient referred to [facility] primary care and neurosurgery.  Patient advised to return to ED if any new or worsening symptoms.  Patients questions were answered at this time and they were agreeable with plan.    I am the Primary Clinician of Record.    FINAL IMPRESSION      1. Sciatica of right side    2. Degeneration of intervertebral disc of lumbar region with lower extremity pain          DISPOSITION/PLAN     DISPOSITION Decision To Discharge 06/04/2024 05:26:18 PM   DISPOSITION CONDITION Stable           PATIENT REFERRED TO:  [facility]  [phone]  Schedule an appointment as soon as possible for a visit       Helga Loan, MD  [address]  [place] [address]  [phone]    Schedule an appointment as soon as possible for a visit       [facility]  [address]  [place]  [address]  [phone]    If new or worsening symptoms      DISCHARGE MEDICATIONS:  Discharge Medication List as of 06/04/2024  5:37 PM        START taking these medications    Details   lidocaine 4 % external patch Place 1 patch onto the skin daily for 5 days, TransDERmal, DAILY Starting Sat 06/04/2024, Until Thu 06/09/2024, For 5 days, Disp-5 each, R-0, Normal      methocarbamol (ROBAXIN) 750 MG tablet Take 1 tablet by mouth 4 times daily for 5 days, Disp-20 tablet, R-0Normal      naproxen (NAPROSYN) 500 MG tablet Take 1 tablet by mouth 2 times daily as needed  for Pain, Disp-10  tablet, R-0Normal             DISCONTINUED MEDICATIONS:  Discharge Medication List as of 06/04/2024  5:37 PM                 (Please note that portions of this note were completed with a voice recognition program.  Efforts were made to edit the dictations but occasionally words are mis-transcribed.)    Bettie Bruce, APRN - CNP (electronically signed)       Bettie Bruce, APRN - CNP  06/05/24 1237
